# Patient Record
Sex: Female | Born: 1980 | Race: Black or African American | Hispanic: No | Marital: Single | Smoking: Never smoker
Health system: Southern US, Community
[De-identification: ages and names within clinical notes are randomized; demographics above are authoritative.]

## PROBLEM LIST (undated history)

## (undated) DIAGNOSIS — D279 Benign neoplasm of unspecified ovary: Secondary | ICD-10-CM

## (undated) DIAGNOSIS — D573 Sickle-cell trait: Secondary | ICD-10-CM

## (undated) DIAGNOSIS — A609 Anogenital herpesviral infection, unspecified: Secondary | ICD-10-CM

## (undated) HISTORY — PX: WISDOM TOOTH EXTRACTION: SHX21

---

## 2003-10-25 ENCOUNTER — Emergency Department (HOSPITAL_COMMUNITY): Admission: EM | Admit: 2003-10-25 | Discharge: 2003-10-26 | Payer: Self-pay | Admitting: Emergency Medicine

## 2004-10-13 IMAGING — US US PELVIS COMPLETE
1 series · 14 of 25 positions shown · non-contrast
Comparison: none

CLINICAL DATA: Positive pregnancy.  Abdominal cramping, vaginal bleeding. 
 TRANSABDOMINAL AND TRANSVAGINAL PELVIC ULTRASOUND ? 10/26/03 
 No intrauterine pregnancy is identified.  The uterus measures 10.6 x 4.6 x 5.6 cm.  
 Within the right ovary, there is a 2.9-cm complex cystic area, with a solid component/nodule.  The left ovary is unremarkable.  A small amount of free fluid is noted in the pelvis.  
 Endometrial stripe measures 15.0 mm.

[Series 1: ob · 0.32mm/px · 14 of 38 slices shown]
[im 1/38]
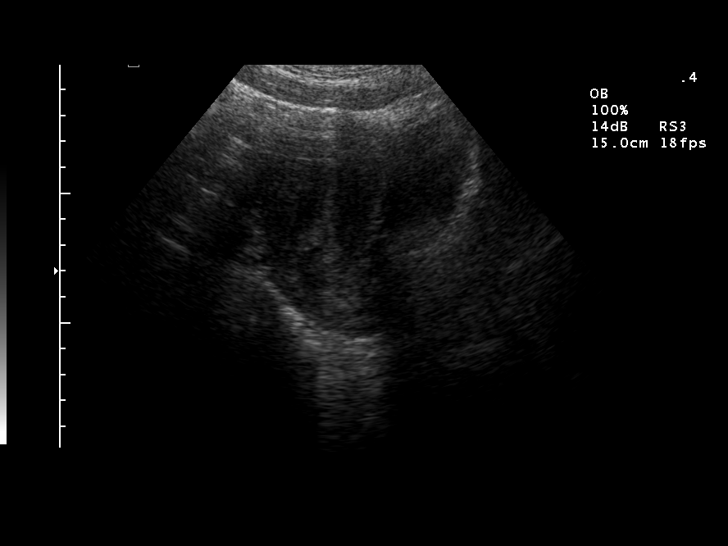
[im 4/38]
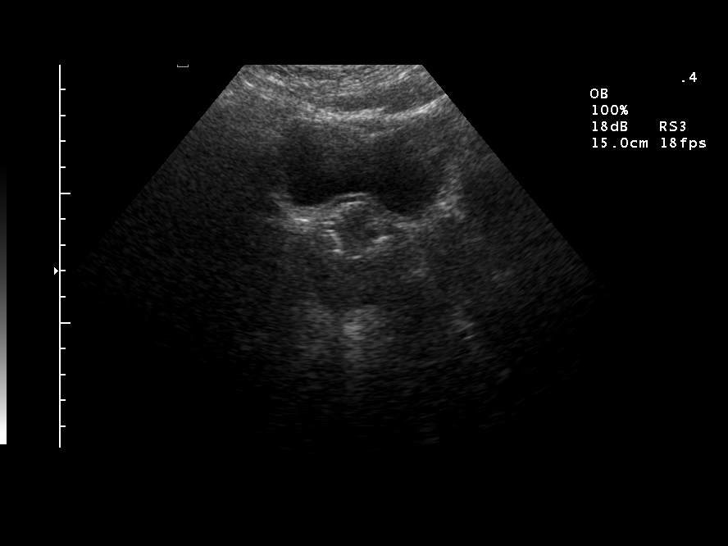
[im 7/38]
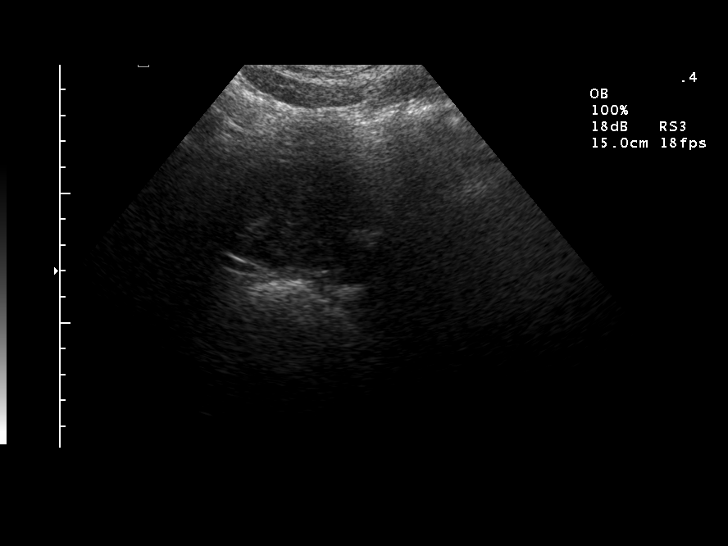
[im 10/38]
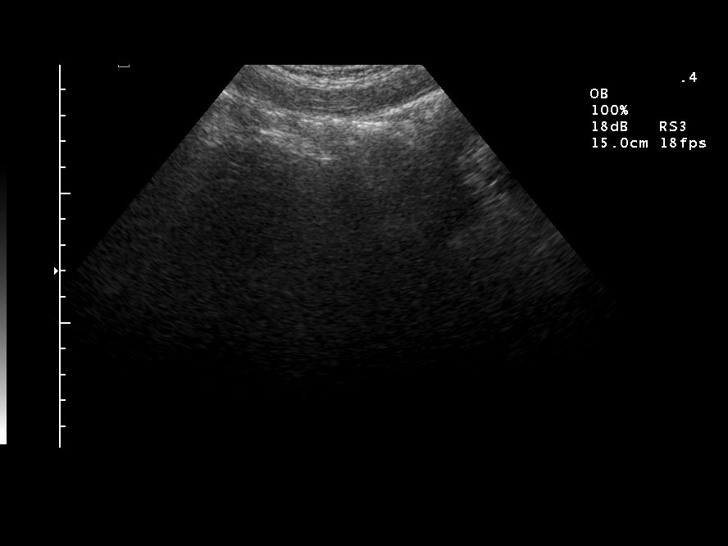
[im 13/38]
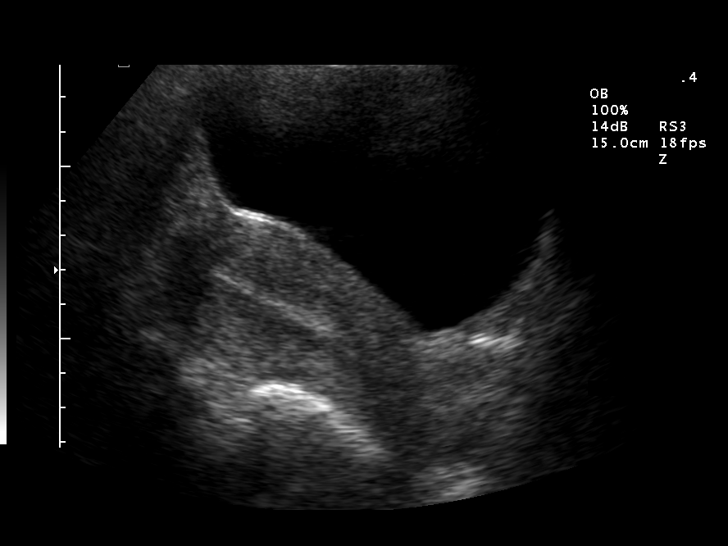
[im 14/38]
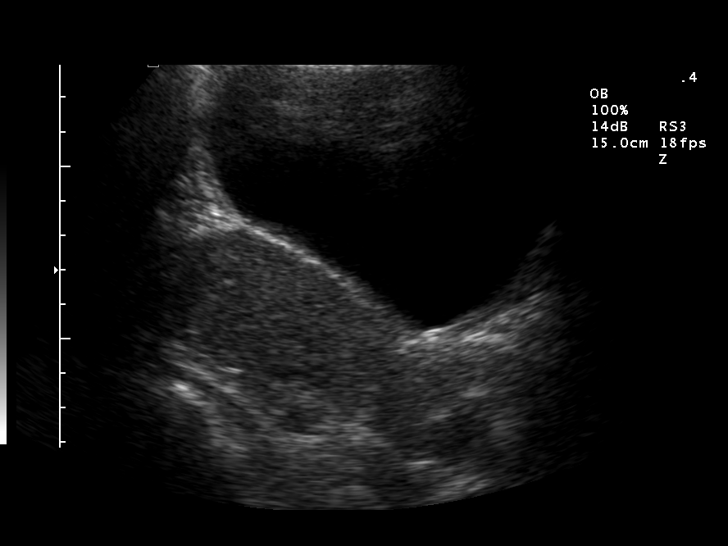
[im 17/38]
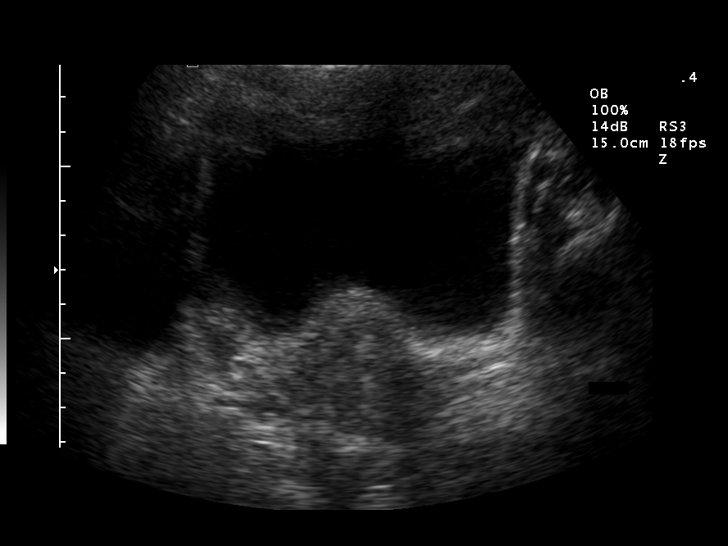
[im 21/38]
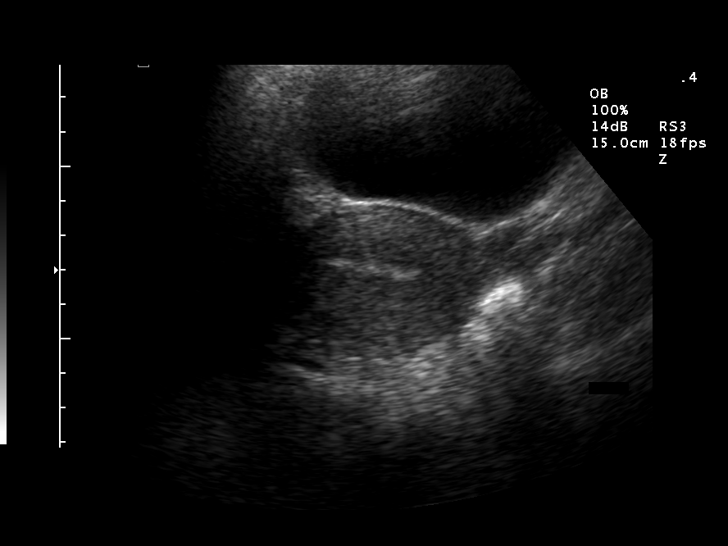
[im 24/38]
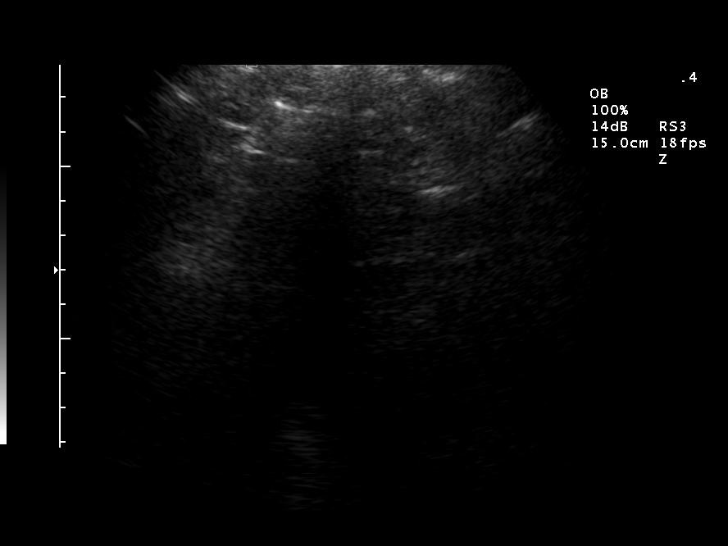
[im 25/38]
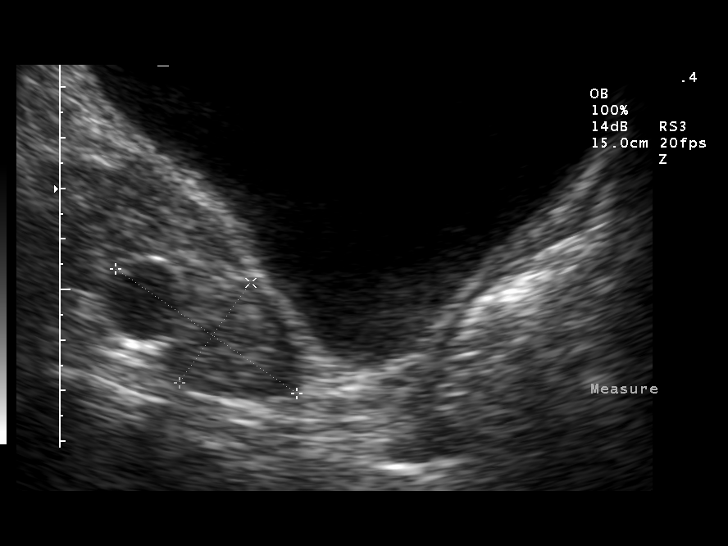
[im 28/38]
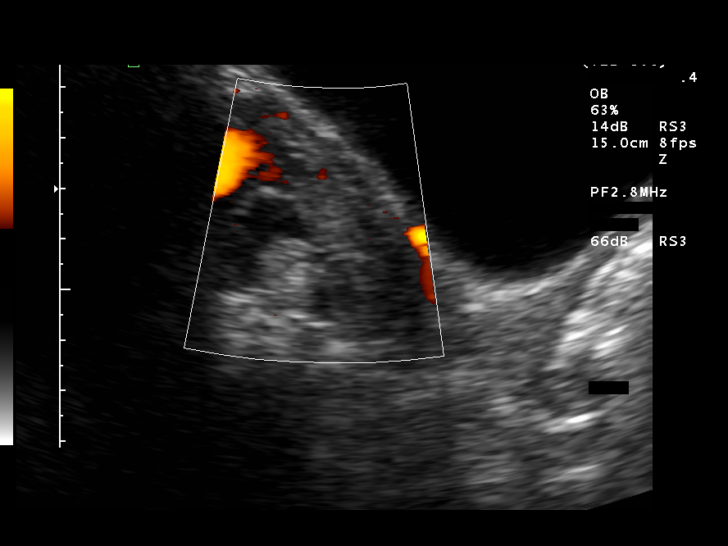
[im 31/38]
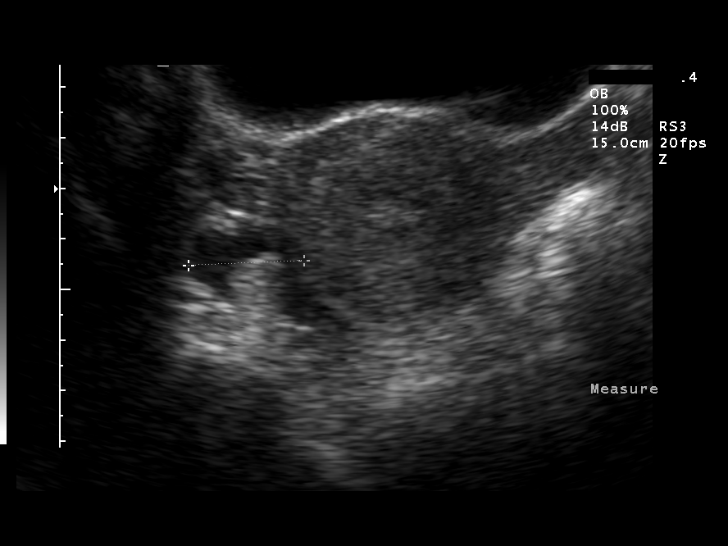
[im 34/38]
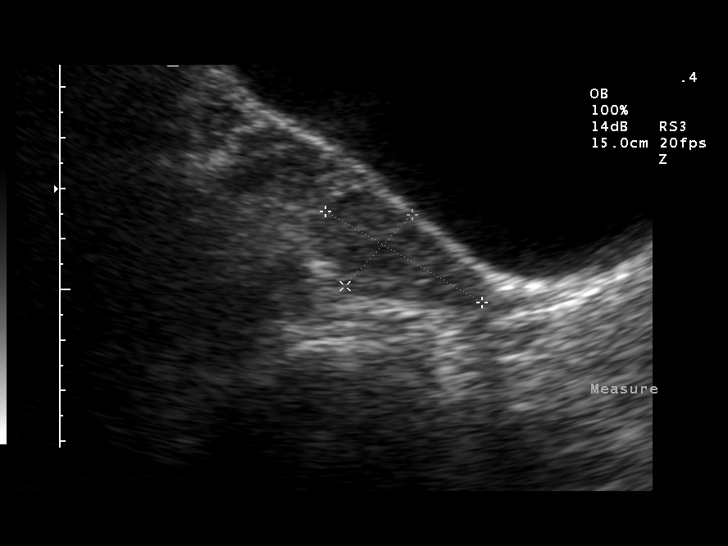
[im 38/38]
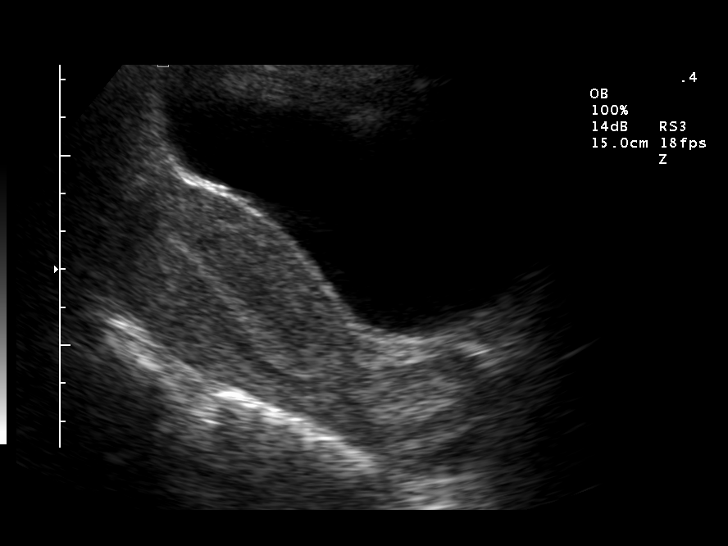

[14 of 25 positions shown; findings below may reference images not displayed]

IMPRESSION: Complex cystic area within the right ovary.  No intrauterine pregnancy present.    With a beta hCG of reportedly greater than [DATE], this constellation of findings could represent ectopic pregnancy or spontaneous abortion.  Recommend correlation with serial beta hCGs.
 Small amount of free fluid.

## 2012-04-07 ENCOUNTER — Ambulatory Visit: Payer: Self-pay | Admitting: Obstetrics and Gynecology

## 2012-05-05 ENCOUNTER — Encounter: Payer: Self-pay | Admitting: Obstetrics and Gynecology

## 2012-05-05 ENCOUNTER — Ambulatory Visit (INDEPENDENT_AMBULATORY_CARE_PROVIDER_SITE_OTHER): Payer: Commercial Managed Care - PPO | Admitting: Obstetrics and Gynecology

## 2012-05-05 VITALS — BP 104/62 | Ht 65.0 in | Wt 156.0 lb

## 2012-05-05 DIAGNOSIS — Z01419 Encounter for gynecological examination (general) (routine) without abnormal findings: Secondary | ICD-10-CM

## 2012-05-05 DIAGNOSIS — Z124 Encounter for screening for malignant neoplasm of cervix: Secondary | ICD-10-CM

## 2012-05-05 NOTE — Progress Notes (Signed)
ANNUAL GYNECOLOGIC EXAMINATION   Amy Giles is a 31 y.o. female, G1P0, who presents for an annual exam. She has a history of an ASCUS Pap smear with HPV dating back to 2009.  Her most recent Pap smears have been normal. She is not sexually active.  She has a history of polycystic ovary syndrome but her periods are now normal.   History   Social History  . Marital Status: Single    Spouse Name: N/A    Number of Children: N/A  . Years of Education: N/A   Social History Main Topics  . Smoking status: Never Smoker   . Smokeless tobacco: Never Used  . Alcohol Use: No  . Drug Use: No  . Sexually Active: No   Other Topics Concern  . None   Social History Narrative  . None    Menstrual cycle:   LMP: Patient's last menstrual period was 04/05/2012.             The following portions of the patient's history were reviewed and updated as appropriate: allergies, current medications, past family history, past medical history, past social history, past surgical history and problem list.  Review of Systems Pertinent items are noted in HPI. Breast:Negative for breast lump,nipple discharge or nipple retraction Gastrointestinal: Negative for abdominal pain, change in bowel habits or rectal bleeding Urinary:negative   Objective:    BP 104/62  Ht 5\' 5"  (1.651 m)  Wt 156 lb (70.761 kg)  BMI 25.96 kg/m2  LMP 04/05/2012    Weight:  Wt Readings from Last 1 Encounters:  05/05/12 156 lb (70.761 kg)          BMI: Body mass index is 25.96 kg/(m^2).  General Appearance: Alert, appropriate appearance for age. No acute distress HEENT: Grossly normal Neck / Thyroid: Supple, no masses, nodes or enlargement Lungs: clear to auscultation bilaterally Back: No CVA tenderness Breast Exam: No masses or nodes.No dimpling, nipple retraction or discharge. Cardiovascular: Regular rate and rhythm. S1, S2, no murmur Gastrointestinal: Soft, non-tender, no masses or  organomegaly  ++++++++++++++++++++++++++++++++++++++++++++++++++++++++  Pelvic Exam: External genitalia: normal general appearance Vaginal: normal without tenderness, induration or masses. Relaxation: No Cervix: normal appearance Adnexa: normal bimanual exam Uterus: normal size, shape, and consistency Rectovaginal: not indicated  ++++++++++++++++++++++++++++++++++++++++++++++++++++++++  Lymphatic Exam: Non-palpable nodes in neck, clavicular, axillary, or inguinal regions Neurologic: Normal speech, no tremor  Psychiatric: Alert and oriented, appropriate affect.  Assessment:    Normal gyn exam   Overweight or obese: Yes   Pelvic relaxation: No  Improved cycles.  History of ASCUS Pap smear with HPV dating back to 2009.   Plan:    pap smear return annually or prn Contraception:abstinence    Medications prescribed: none  STD screen request: No   The updated Pap smear screening guidelines were discussed with the patient. The patient requested that I obtain a Pap smear: Yes.  Kegel exercises discussed: Yes.  Proper diet and regular exercise were reviewed.  Annual mammograms recommended starting at age 90. Proper breast care was discussed.  Screening colonoscopy is recommended beginning at age 76.  Regular health maintenance was reviewed.  Sleep hygiene was discussed.  Adequate calcium and vitamin D intake was emphasized.  Leonard Schwartz M.D.    Regular Periods: yes Mammogram: no  Monthly Breast Ex.: no Exercise: yes  Tetanus < 10 years: unsure Seatbelts: yes  NI. Bladder Functn.: yes Abuse at home: no  Daily BM's: yes Stressful Work: no  Healthy Diet: yes Sigmoid-Colonoscopy: n/a  Calcium: no Medical problems this year: none   LAST PAP: 02/06/2011  Contraception: Abstinence   Mammogram:  N/a  PCP: [facility]  PMH: None  FMH: None  Last Bone Scan: N/a

## 2012-05-06 LAB — PAP IG W/ RFLX HPV ASCU

## 2014-05-17 ENCOUNTER — Encounter: Payer: Self-pay | Admitting: Obstetrics and Gynecology

## 2016-07-03 LAB — OB RESULTS CONSOLE RPR: RPR: NONREACTIVE

## 2016-07-03 LAB — OB RESULTS CONSOLE HIV ANTIBODY (ROUTINE TESTING): HIV: NONREACTIVE

## 2016-07-03 LAB — OB RESULTS CONSOLE HEPATITIS B SURFACE ANTIGEN: Hepatitis B Surface Ag: NEGATIVE

## 2016-07-03 LAB — OB RESULTS CONSOLE GC/CHLAMYDIA
CHLAMYDIA, DNA PROBE: NEGATIVE
Gonorrhea: NEGATIVE

## 2016-07-03 LAB — OB RESULTS CONSOLE ABO/RH: RH Type: POSITIVE

## 2016-07-03 LAB — OB RESULTS CONSOLE RUBELLA ANTIBODY, IGM: Rubella: IMMUNE

## 2016-07-03 LAB — OB RESULTS CONSOLE ANTIBODY SCREEN: Antibody Screen: NEGATIVE

## 2017-01-31 ENCOUNTER — Encounter (HOSPITAL_COMMUNITY): Payer: Self-pay

## 2017-02-04 ENCOUNTER — Other Ambulatory Visit: Payer: Self-pay | Admitting: Obstetrics and Gynecology

## 2017-02-05 ENCOUNTER — Inpatient Hospital Stay (HOSPITAL_COMMUNITY)
Admission: AD | Admit: 2017-02-05 | Discharge: 2017-02-08 | DRG: 765 | Disposition: A | Discharge status: Home / Self Care | Payer: Commercial Managed Care - PPO | Source: Ambulatory Visit | Attending: Obstetrics and Gynecology | Admitting: Obstetrics and Gynecology

## 2017-02-05 ENCOUNTER — Encounter (HOSPITAL_COMMUNITY): Payer: Self-pay | Admitting: Anesthesiology

## 2017-02-05 ENCOUNTER — Encounter (HOSPITAL_COMMUNITY): Payer: Self-pay | Admitting: *Deleted

## 2017-02-05 ENCOUNTER — Encounter (HOSPITAL_COMMUNITY)
Admission: AD | Disposition: A | Discharge status: Home / Self Care | Payer: Self-pay | Source: Ambulatory Visit | Attending: Obstetrics and Gynecology

## 2017-02-05 ENCOUNTER — Inpatient Hospital Stay (HOSPITAL_COMMUNITY): Payer: Commercial Managed Care - PPO | Admitting: Anesthesiology

## 2017-02-05 ENCOUNTER — Inpatient Hospital Stay (HOSPITAL_COMMUNITY): Payer: Commercial Managed Care - PPO

## 2017-02-05 DIAGNOSIS — A6000 Herpesviral infection of urogenital system, unspecified: Secondary | ICD-10-CM | POA: Diagnosis present

## 2017-02-05 DIAGNOSIS — O4292 Full-term premature rupture of membranes, unspecified as to length of time between rupture and onset of labor: Secondary | ICD-10-CM | POA: Diagnosis present

## 2017-02-05 DIAGNOSIS — D271 Benign neoplasm of left ovary: Secondary | ICD-10-CM | POA: Diagnosis present

## 2017-02-05 DIAGNOSIS — B009 Herpesviral infection, unspecified: Secondary | ICD-10-CM

## 2017-02-05 DIAGNOSIS — Z6837 Body mass index (BMI) 37.0-37.9, adult: Secondary | ICD-10-CM

## 2017-02-05 DIAGNOSIS — O321XX0 Maternal care for breech presentation, not applicable or unspecified: Secondary | ICD-10-CM | POA: Diagnosis present

## 2017-02-05 DIAGNOSIS — O9832 Other infections with a predominantly sexual mode of transmission complicating childbirth: Secondary | ICD-10-CM | POA: Diagnosis present

## 2017-02-05 DIAGNOSIS — B951 Streptococcus, group B, as the cause of diseases classified elsewhere: Secondary | ICD-10-CM

## 2017-02-05 DIAGNOSIS — O4290 Premature rupture of membranes, unspecified as to length of time between rupture and onset of labor, unspecified weeks of gestation: Secondary | ICD-10-CM

## 2017-02-05 DIAGNOSIS — O99824 Streptococcus B carrier state complicating childbirth: Secondary | ICD-10-CM | POA: Diagnosis present

## 2017-02-05 DIAGNOSIS — O9902 Anemia complicating childbirth: Secondary | ICD-10-CM | POA: Diagnosis present

## 2017-02-05 DIAGNOSIS — O99214 Obesity complicating childbirth: Secondary | ICD-10-CM | POA: Diagnosis present

## 2017-02-05 DIAGNOSIS — Z98891 History of uterine scar from previous surgery: Secondary | ICD-10-CM

## 2017-02-05 DIAGNOSIS — Z880 Allergy status to penicillin: Secondary | ICD-10-CM | POA: Diagnosis not present

## 2017-02-05 DIAGNOSIS — D279 Benign neoplasm of unspecified ovary: Secondary | ICD-10-CM

## 2017-02-05 DIAGNOSIS — Z3689 Encounter for other specified antenatal screening: Secondary | ICD-10-CM

## 2017-02-05 DIAGNOSIS — O3480 Maternal care for other abnormalities of pelvic organs, unspecified trimester: Secondary | ICD-10-CM

## 2017-02-05 DIAGNOSIS — Z3A38 38 weeks gestation of pregnancy: Secondary | ICD-10-CM

## 2017-02-05 DIAGNOSIS — O3483 Maternal care for other abnormalities of pelvic organs, third trimester: Secondary | ICD-10-CM | POA: Diagnosis present

## 2017-02-05 DIAGNOSIS — D573 Sickle-cell trait: Secondary | ICD-10-CM | POA: Diagnosis present

## 2017-02-05 HISTORY — PX: OVARIAN CYST REMOVAL: SHX89

## 2017-02-05 LAB — CBC
HCT: 33.4 % — ABNORMAL LOW (ref 36.0–46.0)
HEMOGLOBIN: 11.3 g/dL — AB (ref 12.0–15.0)
MCH: 26.7 pg (ref 26.0–34.0)
MCHC: 33.8 g/dL (ref 30.0–36.0)
MCV: 79 fL (ref 78.0–100.0)
PLATELETS: 323 10*3/uL (ref 150–400)
RBC: 4.23 MIL/uL (ref 3.87–5.11)
RDW: 14.5 % (ref 11.5–15.5)
WBC: 11 10*3/uL — ABNORMAL HIGH (ref 4.0–10.5)

## 2017-02-05 LAB — POCT FERN TEST: POCT Fern Test: POSITIVE

## 2017-02-05 LAB — TYPE AND SCREEN
ABO/RH(D): A POS
ANTIBODY SCREEN: NEGATIVE

## 2017-02-05 LAB — ABO/RH: ABO/RH(D): A POS

## 2017-02-05 SURGERY — Surgical Case
Anesthesia: Spinal

## 2017-02-05 MED ORDER — PROMETHAZINE HCL 25 MG/ML IJ SOLN: 6.2500 mg | INTRAMUSCULAR | Status: DC | PRN

## 2017-02-05 MED ORDER — DIPHENHYDRAMINE HCL 25 MG PO CAPS
25.0000 mg | ORAL_CAPSULE | ORAL | Status: DC | PRN
  Filled 2017-02-05: qty 1

## 2017-02-05 MED ORDER — OXYTOCIN 40 UNITS IN LACTATED RINGERS INFUSION - SIMPLE MED: 2.5000 [IU]/h | INTRAVENOUS | Status: AC

## 2017-02-05 MED ORDER — DIBUCAINE 1 % RE OINT: 1.0000 "application " | TOPICAL_OINTMENT | RECTAL | Status: DC | PRN

## 2017-02-05 MED ORDER — NALBUPHINE SYRINGE 5 MG/0.5 ML: 5.0000 mg | INJECTION | Freq: Once | INTRAMUSCULAR | Status: DC | PRN

## 2017-02-05 MED ORDER — COCONUT OIL OIL: 1.0000 "application " | TOPICAL_OIL | Status: DC | PRN

## 2017-02-05 MED ORDER — SOD CITRATE-CITRIC ACID 500-334 MG/5ML PO SOLN
30.0000 mL | Freq: Once | ORAL | Status: AC
  Administered 2017-02-05: 30 mL via ORAL

## 2017-02-05 MED ORDER — TETANUS-DIPHTH-ACELL PERTUSSIS 5-2.5-18.5 LF-MCG/0.5 IM SUSP: 0.5000 mL | Freq: Once | INTRAMUSCULAR | Status: DC

## 2017-02-05 MED ORDER — NALBUPHINE SYRINGE 5 MG/0.5 ML: 5.0000 mg | INJECTION | INTRAMUSCULAR | Status: DC | PRN

## 2017-02-05 MED ORDER — FAMOTIDINE IN NACL 20-0.9 MG/50ML-% IV SOLN
20.0000 mg | Freq: Once | INTRAVENOUS | Status: AC
  Administered 2017-02-05: 20 mg via INTRAVENOUS

## 2017-02-05 MED ORDER — VALACYCLOVIR HCL 500 MG PO TABS
1000.0000 mg | ORAL_TABLET | Freq: Every day | ORAL | Status: DC
  Filled 2017-02-05 (×2): qty 2

## 2017-02-05 MED ORDER — SIMETHICONE 80 MG PO CHEW
80.0000 mg | CHEWABLE_TABLET | Freq: Three times a day (TID) | ORAL | Status: DC
  Administered 2017-02-05 – 2017-02-08 (×6): 80 mg via ORAL
  Filled 2017-02-05 (×7): qty 1

## 2017-02-05 MED ORDER — IBUPROFEN 600 MG PO TABS
600.0000 mg | ORAL_TABLET | Freq: Four times a day (QID) | ORAL | Status: DC
  Administered 2017-02-05 – 2017-02-08 (×12): 600 mg via ORAL
  Filled 2017-02-05 (×12): qty 1

## 2017-02-05 MED ORDER — KETOROLAC TROMETHAMINE 30 MG/ML IJ SOLN
30.0000 mg | Freq: Four times a day (QID) | INTRAMUSCULAR | Status: DC | PRN
  Administered 2017-02-05: 30 mg via INTRAMUSCULAR

## 2017-02-05 MED ORDER — ZOLPIDEM TARTRATE 5 MG PO TABS: 5.0000 mg | ORAL_TABLET | Freq: Every evening | ORAL | Status: DC | PRN

## 2017-02-05 MED ORDER — MENTHOL 3 MG MT LOZG: 1.0000 | LOZENGE | OROMUCOSAL | Status: DC | PRN

## 2017-02-05 MED ORDER — FENTANYL CITRATE (PF) 100 MCG/2ML IJ SOLN
INTRAMUSCULAR | Status: AC
  Filled 2017-02-05: qty 2

## 2017-02-05 MED ORDER — BUPIVACAINE IN DEXTROSE 0.75-8.25 % IT SOLN
INTRATHECAL | Status: AC
  Filled 2017-02-05: qty 2

## 2017-02-05 MED ORDER — ONDANSETRON HCL 4 MG/2ML IJ SOLN
INTRAMUSCULAR | Status: AC
  Filled 2017-02-05: qty 2

## 2017-02-05 MED ORDER — BUPIVACAINE IN DEXTROSE 0.75-8.25 % IT SOLN
INTRATHECAL | Status: DC | PRN
  Administered 2017-02-05: 12 mg via INTRATHECAL

## 2017-02-05 MED ORDER — NALOXONE HCL 0.4 MG/ML IJ SOLN: 0.4000 mg | INTRAMUSCULAR | Status: DC | PRN

## 2017-02-05 MED ORDER — PHENYLEPHRINE 8 MG IN D5W 100 ML (0.08MG/ML) PREMIX OPTIME
INJECTION | INTRAVENOUS | Status: DC | PRN
  Administered 2017-02-05: 60 ug/min via INTRAVENOUS

## 2017-02-05 MED ORDER — LACTATED RINGERS IV SOLN
INTRAVENOUS | Status: DC | PRN
  Administered 2017-02-05: 40 [IU] via INTRAVENOUS

## 2017-02-05 MED ORDER — KETOROLAC TROMETHAMINE 30 MG/ML IJ SOLN: 30.0000 mg | Freq: Four times a day (QID) | INTRAMUSCULAR | Status: DC | PRN

## 2017-02-05 MED ORDER — HYDROMORPHONE HCL 1 MG/ML IJ SOLN: 0.2500 mg | INTRAMUSCULAR | Status: DC | PRN

## 2017-02-05 MED ORDER — MEPERIDINE HCL 25 MG/ML IJ SOLN: 6.2500 mg | INTRAMUSCULAR | Status: DC | PRN

## 2017-02-05 MED ORDER — SODIUM CHLORIDE 0.9% FLUSH: 3.0000 mL | INTRAVENOUS | Status: DC | PRN

## 2017-02-05 MED ORDER — KETOROLAC TROMETHAMINE 30 MG/ML IJ SOLN: 30.0000 mg | Freq: Once | INTRAMUSCULAR | Status: DC | PRN

## 2017-02-05 MED ORDER — PRENATAL MULTIVITAMIN CH
1.0000 | ORAL_TABLET | Freq: Every day | ORAL | Status: DC
  Administered 2017-02-06 – 2017-02-07 (×2): 1 via ORAL
  Filled 2017-02-05 (×2): qty 1

## 2017-02-05 MED ORDER — DEXTROSE 5 % IV SOLN
1.0000 ug/kg/h | INTRAVENOUS | Status: DC | PRN
  Filled 2017-02-05: qty 2

## 2017-02-05 MED ORDER — KETOROLAC TROMETHAMINE 30 MG/ML IJ SOLN
INTRAMUSCULAR | Status: AC
  Filled 2017-02-05: qty 1

## 2017-02-05 MED ORDER — DIPHENHYDRAMINE HCL 25 MG PO CAPS
25.0000 mg | ORAL_CAPSULE | Freq: Four times a day (QID) | ORAL | Status: DC | PRN
  Administered 2017-02-05 – 2017-02-06 (×2): 25 mg via ORAL
  Filled 2017-02-05 (×2): qty 1

## 2017-02-05 MED ORDER — ONDANSETRON HCL 4 MG/2ML IJ SOLN
INTRAMUSCULAR | Status: DC | PRN
  Administered 2017-02-05: 4 mg via INTRAVENOUS

## 2017-02-05 MED ORDER — LACTATED RINGERS IV SOLN
INTRAVENOUS | Status: DC
Start: 2017-02-05 — End: 2017-02-05
  Administered 2017-02-05 (×3): via INTRAVENOUS

## 2017-02-05 MED ORDER — FAMOTIDINE IN NACL 20-0.9 MG/50ML-% IV SOLN
INTRAVENOUS | Status: AC
  Filled 2017-02-05: qty 50

## 2017-02-05 MED ORDER — SIMETHICONE 80 MG PO CHEW
80.0000 mg | CHEWABLE_TABLET | ORAL | Status: DC
  Administered 2017-02-05 – 2017-02-07 (×3): 80 mg via ORAL
  Filled 2017-02-05 (×3): qty 1

## 2017-02-05 MED ORDER — GENTAMICIN SULFATE 40 MG/ML IJ SOLN
INTRAMUSCULAR | Status: AC
  Administered 2017-02-05: 115.5 mL via INTRAVENOUS
  Filled 2017-02-05: qty 9.5

## 2017-02-05 MED ORDER — DIPHENHYDRAMINE HCL 50 MG/ML IJ SOLN: 12.5000 mg | INTRAMUSCULAR | Status: DC | PRN

## 2017-02-05 MED ORDER — OXYTOCIN 10 UNIT/ML IJ SOLN
INTRAMUSCULAR | Status: AC
  Filled 2017-02-05: qty 4

## 2017-02-05 MED ORDER — MORPHINE SULFATE (PF) 0.5 MG/ML IJ SOLN
INTRAMUSCULAR | Status: DC | PRN
  Administered 2017-02-05: .2 mg via INTRATHECAL

## 2017-02-05 MED ORDER — SENNOSIDES-DOCUSATE SODIUM 8.6-50 MG PO TABS
2.0000 | ORAL_TABLET | ORAL | Status: DC
  Administered 2017-02-05 – 2017-02-07 (×3): 2 via ORAL
  Filled 2017-02-05 (×3): qty 2

## 2017-02-05 MED ORDER — SOD CITRATE-CITRIC ACID 500-334 MG/5ML PO SOLN
ORAL | Status: AC
  Filled 2017-02-05: qty 15

## 2017-02-05 MED ORDER — SCOPOLAMINE 1 MG/3DAYS TD PT72: 1.0000 | MEDICATED_PATCH | Freq: Once | TRANSDERMAL | Status: DC

## 2017-02-05 MED ORDER — ACETAMINOPHEN 500 MG PO TABS: 1000.0000 mg | ORAL_TABLET | Freq: Four times a day (QID) | ORAL | Status: DC

## 2017-02-05 MED ORDER — ONDANSETRON HCL 4 MG/2ML IJ SOLN
4.0000 mg | Freq: Three times a day (TID) | INTRAMUSCULAR | Status: DC | PRN
Start: 2017-02-05 — End: 2017-02-05

## 2017-02-05 MED ORDER — SIMETHICONE 80 MG PO CHEW: 80.0000 mg | CHEWABLE_TABLET | ORAL | Status: DC | PRN

## 2017-02-05 MED ORDER — ACETAMINOPHEN 325 MG PO TABS
650.0000 mg | ORAL_TABLET | ORAL | Status: DC | PRN
  Administered 2017-02-05 – 2017-02-07 (×2): 650 mg via ORAL
  Filled 2017-02-05 (×2): qty 2

## 2017-02-05 MED ORDER — MORPHINE SULFATE (PF) 0.5 MG/ML IJ SOLN
INTRAMUSCULAR | Status: AC
  Filled 2017-02-05: qty 10

## 2017-02-05 MED ORDER — WITCH HAZEL-GLYCERIN EX PADS: 1.0000 "application " | MEDICATED_PAD | CUTANEOUS | Status: DC | PRN

## 2017-02-05 MED ORDER — FENTANYL CITRATE (PF) 100 MCG/2ML IJ SOLN
INTRAMUSCULAR | Status: DC | PRN
  Administered 2017-02-05: 20 ug via INTRAVENOUS

## 2017-02-05 MED ORDER — LACTATED RINGERS IV SOLN
INTRAVENOUS | Status: DC
  Administered 2017-02-05: 19:00:00 via INTRAVENOUS

## 2017-02-05 SURGICAL SUPPLY — 37 items
BENZOIN TINCTURE PRP APPL 2/3 (GAUZE/BANDAGES/DRESSINGS) ×4 IMPLANT
CHLORAPREP W/TINT 26ML (MISCELLANEOUS) ×4 IMPLANT
CLAMP CORD UMBIL (MISCELLANEOUS) IMPLANT
CLOSURE STERI STRIP 1/2 X4 (GAUZE/BANDAGES/DRESSINGS) ×3 IMPLANT
CLOSURE WOUND 1/2 X4 (GAUZE/BANDAGES/DRESSINGS) ×1
CLOTH BEACON ORANGE TIMEOUT ST (SAFETY) ×4 IMPLANT
CONTAINER PREFILL 10% NBF 15ML (MISCELLANEOUS) IMPLANT
DRSG OPSITE POSTOP 4X10 (GAUZE/BANDAGES/DRESSINGS) ×4 IMPLANT
ELECT REM PT RETURN 9FT ADLT (ELECTROSURGICAL) ×4
ELECTRODE REM PT RTRN 9FT ADLT (ELECTROSURGICAL) ×2 IMPLANT
EXTRACTOR VACUUM M CUP 4 TUBE (SUCTIONS) IMPLANT
EXTRACTOR VACUUM M CUP 4' TUBE (SUCTIONS)
GLOVE BIO SURGEON STRL SZ7.5 (GLOVE) ×4 IMPLANT
GLOVE BIOGEL PI IND STRL 7.0 (GLOVE) ×2 IMPLANT
GLOVE BIOGEL PI IND STRL 7.5 (GLOVE) ×2 IMPLANT
GLOVE BIOGEL PI INDICATOR 7.0 (GLOVE) ×2
GLOVE BIOGEL PI INDICATOR 7.5 (GLOVE) ×2
GOWN STRL REUS W/TWL LRG LVL3 (GOWN DISPOSABLE) ×8 IMPLANT
HEMOSTAT SURGICEL 2X3 (HEMOSTASIS) ×4 IMPLANT
KIT ABG SYR 3ML LUER SLIP (SYRINGE) IMPLANT
NEEDLE HYPO 25X5/8 SAFETYGLIDE (NEEDLE) IMPLANT
NS IRRIG 1000ML POUR BTL (IV SOLUTION) ×4 IMPLANT
PACK C SECTION WH (CUSTOM PROCEDURE TRAY) ×4 IMPLANT
PAD OB MATERNITY 4.3X12.25 (PERSONAL CARE ITEMS) ×4 IMPLANT
PENCIL SMOKE EVAC W/HOLSTER (ELECTROSURGICAL) ×4 IMPLANT
RTRCTR C-SECT PINK 25CM LRG (MISCELLANEOUS) ×4 IMPLANT
STRIP CLOSURE SKIN 1/2X4 (GAUZE/BANDAGES/DRESSINGS) ×3 IMPLANT
SUT CHROMIC 2 0 CT 1 (SUTURE) ×4 IMPLANT
SUT MNCRL AB 3-0 PS2 27 (SUTURE) ×4 IMPLANT
SUT PLAIN 2 0 XLH (SUTURE) ×4 IMPLANT
SUT VIC AB 0 CT1 36 (SUTURE) ×4 IMPLANT
SUT VIC AB 0 CTX 36 (SUTURE) ×6
SUT VIC AB 0 CTX36XBRD ANBCTRL (SUTURE) ×6 IMPLANT
SUT VIC AB 2-0 SH 27 (SUTURE) ×4
SUT VIC AB 2-0 SH 27XBRD (SUTURE) ×4 IMPLANT
TOWEL OR 17X24 6PK STRL BLUE (TOWEL DISPOSABLE) ×4 IMPLANT
TRAY FOLEY BAG SILVER LF 14FR (SET/KITS/TRAYS/PACK) ×4 IMPLANT

## 2017-02-05 NOTE — Lactation Note (Signed)
This note was copied from a baby's chart. Lactation Consultation Note  Patient Name: Amy Giles [id number] Date: 02/05/2017 Reason for consult: Initial assessment Infant is 18 hours old and seen by Lactation for Initial assessment. Baby was born at [redacted]w[redacted]d. RN had told LC that mom had decided to pump and bottle feed. Baby was with mom when [place] entered. Mom reported that she tried BF a few times but he wouldn't latch so she wants to just pump. Mom reports she pumped once and didn't get any milk.  Discussed milk volume, stomach size, difference between pumping & BF especially in the early days, how BF is a new skill that can take practice, offered latch help from her RN or IBCLC but that it is her decision and that staff are here to support her. Reviewed pumping goals of at least 8-12x in 24 hrs for 15-20 mins followed by ~5 mins of hand expression. Mom stated she would like to be shown how to hand express later so LC just reviewed technique and encouraged mom to ask her RN later. Mom reports she has a personal pump from insurance but was unsure of the brand and plans to have someone bring it to the hospital for staff to review it with her. Discussed difference between personal & hospital-grade pumps for exclusive pumping. Provided mom with BF booklet, BF resources, and feeding log; mom made aware of O/P services, breastfeeding support groups, community resources, and our phone # for post-discharge questions.  Mom reports no questions at this time. Encouraged mom to consider trying to BF again with assistance but if not, to at least continue pumping q 3hrs, while not watching the bottles and that no to a few drops is normal for the first few days of pumping. Encouraged mom to ask for help as needed.   Maternal Data    Feeding Feeding Type: Bottle Fed - Formula Nipple Type: Slow - flow  LATCH Score/Interventions                      Lactation Tools Discussed/Used Pump Review: Setup,  frequency, and cleaning Initiated by:: EB Date initiated:: 02/05/17   Consult Status Consult Status: Follow-up Date: 02/06/17 Follow-up type: In-patient    Amy Giles 02/05/2017, 6:46 PM

## 2017-02-05 NOTE — MAU Note (Signed)
Pt presents to MAU with complaints rupture of membranes at 800 this morning. Denies any VB. Pt is scheduled for C/S on Monday July the 30th due to breech presentation

## 2017-02-05 NOTE — Anesthesia Postprocedure Evaluation (Signed)
Anesthesia Post Note  Patient: Amy Giles  Procedure(s) Performed: Procedure(s) (LRB): CESAREAN SECTION (N/A) OVARIAN CYSTECTOMY (Left)     Patient location during evaluation: PACU Anesthesia Type: Spinal Level of consciousness: awake Pain management: pain level controlled Vital Signs Assessment: post-procedure vital signs reviewed and stable Respiratory status: spontaneous breathing Cardiovascular status: stable Postop Assessment: no backache, spinal receding, no headache, patient able to bend at knees and no signs of nausea or vomiting Anesthetic complications: no    Last Vitals:  Vitals:   02/05/17 1434 02/05/17 1436  BP: 121/65   Pulse:    Resp: (!) 22 15  Temp:      Last Pain:  Vitals:   02/05/17 1316  TempSrc: Oral   Pain Goal:                 Careli Luzader JR,JOHN Coumba Kellison

## 2017-02-05 NOTE — Anesthesia Procedure Notes (Addendum)
Spinal  Start time: 02/05/2017 11:35 AM End time: 02/05/2017 11:38 AM Staffing Anesthesiologist: Lyn Hollingshead Performed: anesthesiologist  Preanesthetic Checklist Completed: patient identified, surgical consent, pre-op evaluation, timeout performed, IV checked, risks and benefits discussed and monitors and equipment checked Spinal Block Patient position: sitting Prep: site prepped and draped and DuraPrep Patient monitoring: heart rate, cardiac monitor, continuous pulse ox and blood pressure Location: L3-4 Needle Needle type: Pencan  Needle gauge: 24 G Needle insertion depth: 5 cm Assessment Sensory level: T4

## 2017-02-05 NOTE — Anesthesia Preprocedure Evaluation (Signed)
Anesthesia Evaluation  Patient identified by MRN, date of birth, ID band Patient awake    Reviewed: Allergy & Precautions, H&P , NPO status , Patient's Chart, lab work & pertinent test results  Airway Mallampati: II  TM Distance: >3 FB Neck ROM: full    Dental no notable dental hx. (+) Teeth Intact   Pulmonary neg pulmonary ROS,    Pulmonary exam normal breath sounds clear to auscultation       Cardiovascular negative cardio ROS Normal cardiovascular exam Rhythm:regular Rate:Normal     Neuro/Psych negative neurological ROS  negative psych ROS   GI/Hepatic negative GI ROS, Neg liver ROS,   Endo/Other  Morbid obesity  Renal/GU negative Renal ROS     Musculoskeletal   Abdominal (+) + obese,   Peds  Hematology negative hematology ROS (+) Blood dyscrasia, anemia ,   Anesthesia Other Findings   Reproductive/Obstetrics (+) Pregnancy                             Anesthesia Physical Anesthesia Plan  ASA: III  Anesthesia Plan: Spinal   Post-op Pain Management:    Induction:   PONV Risk Score and Plan: 3 and Ondansetron, Dexamethasone, Propofol, Midazolam and Treatment may vary due to age or medical condition  Airway Management Planned:   Additional Equipment:   Intra-op Plan:   Post-operative Plan:   Informed Consent: I have reviewed the patients History and Physical, chart, labs and discussed the procedure including the risks, benefits and alternatives for the proposed anesthesia with the patient or authorized representative who has indicated his/her understanding and acceptance.     Plan Discussed with: CRNA and Surgeon  Anesthesia Plan Comments:         Anesthesia Quick Evaluation

## 2017-02-05 NOTE — Op Note (Addendum)
Cesarean Section Procedure Note  Indications: P0 at 35 2/7wks presenting with PROM and breech.  Pt initially scheduled for c-section on 7/30th.  Pre-operative Diagnosis: 1.38 2/7wks 2.Breech Presentation 3.PROM 4.LEFT OVARIAN CYST   Post-operative Diagnosis: 1.38 2/7wks 2.Breech Presentation 3.PROM 4.LEFT OVARIAN CYST  Procedure: 1.PRIMARY LOW TRANSVERSE CESAREAN SECTION 2.LEFT OVARIAN CYSTECTOMY  Surgeon: Everett Graff, MD    Assistants: Yvonne Kendall, CNM  Anesthesia: Regional  Anesthesiologist: Dr. York Cerise   Procedure Details  The patient was taken to the operating room secondary to breech and PROM after the risks, benefits, complications, treatment options, and expected outcomes were discussed with the patient.  The patient concurred with the proposed plan, giving informed consent which was signed and witnessed. The patient was taken to Operating Room One, identified as Amy Giles and the procedure verified as C-Section Delivery. A Time Out was held and the above information confirmed.  After induction of anesthesia by obtaining a spinal, the patient was prepped and draped in the usual sterile manner. A Pfannenstiel skin incision was made and carried down through the subcutaneous tissue to the underlying layer of fascia.  The fascia was incised bilaterally and extended transversely bilaterally with the Mayo scissors. Kocher clamps were placed on the inferior aspect of the fascial incision and the underlying rectus muscle was separated from the fascia. The same was done on the superior aspect of the fascial incision.  The peritoneum was identified, entered bluntly and extended manually.  An Alexis self-retaining retractor was placed.  The utero-vesical peritoneal reflection was incised transversely and the bladder flap was bluntly freed from the lower uterine segment. A low transverse uterine incision was made with the scalpel and extended bilaterally with the bandage  scissors.  The infant was delivered ivia breech extraction.  After the umbilical cord was clamped and cut, the infant was handed to the awaiting pediatricians.  Cord blood was obtained for evaluation.  The placenta was removed intact and appeared to be within normal limits. The uterus was cleared of all clots and debris. The uterine incision was closed with running interlocking sutures of 0 Vicryl and a second imbricating layer was performed as well.   Bilateral tubes and ovaries appeared to be within normal limits except for a known left ovarian cyst.  An incision was made over the ovarian wall covering cyst.  Cyst was dissected out without difficulty without rupture and handed off to be sent to pathology.  The ovarian bed was made hemostatic with the bovie.  Good hemostasis was noted.  To ensure hemostasis, surgicel was placed in the ovarian bed.  Copious irrigation was performed until clear.  The peritoneum was repaired with 2-0 chromic via a running suture.  The fascia was reapproximated with a running suture of 0 Vicryl. The subcutaneous tissue was reapproximated with 3 interrupted sutures of 2-0 plain.  The skin was reapproximated with a subcuticular suture of 3-0 monocryl.  Steristrips were applied.  Instrument, sponge, and needle counts were correct prior to abdominal closure and at the conclusion of the case.  The patient was awaiting transfer to the recovery room in good condition.  Findings: Live female infant with Apgars 7 at one minute and 9 at five minutes.  Normal appearing bilateral ovaries and fallopian tubes were noted except for the left ovarian cyst noted above that was about 3cm.    Estimated Blood Loss:  800 ml         Drains: foley to gravity 325 cc  Total IV Fluids: 2400 ml         Specimens to Pathology: Left Ovarian Cyst (intact)         Complications:  None; patient tolerated the procedure well.         Disposition: PACU - hemodynamically stable.         Condition:  stable  Attending Attestation: I performed the procedure.

## 2017-02-05 NOTE — H&P (Signed)
LABOR ADMISSION HISTORY AND PHYSICAL  Amy Giles is a 36 y.o. female G2P0010 with IUP at [redacted]w[redacted]d  presenting for gross SROM @ 0800am. Her pregnancy is complicated by breech presentation; HSV 1 vaginal- suppression started at 36 weeks.; GBS positive- allergic to pcn and dermoid cyst on ovary and sickle cell trait.  She reports +FMs, No LOF, no VB, no blurry vision, headaches or peripheral edema, and RUQ pain.  She plans on breastfeeding feeding. Inpatient Circumcision   3881g EFW   Prenatal History/Complications:  Past Medical History: Past Medical History:  Diagnosis Date  . Benign teratoma of ovary   . HSV (herpes simplex virus) anogenital infection   . Sickle cell trait [facility])     Past Surgical History: Past Surgical History:  Procedure Laterality Date  . WISDOM TOOTH EXTRACTION      Obstetrical History: OB History    Gravida Para Term Preterm AB Living   2 0     1 0   SAB TAB Ectopic Multiple Live Births   1              Social History: Social History   Social History  . Marital status: Single    Spouse name: N/A  . Number of children: N/A  . Years of education: N/A   Social History Main Topics  . Smoking status: Never Smoker  . Smokeless tobacco: Never Used  . Alcohol use No  . Drug use: No  . Sexual activity: No   Other Topics Concern  . None   Social History Narrative  . None    Family History: Family History  Problem Relation Age of Onset  . Hypertension Mother   . Hypertension Maternal Aunt   . Diabetes Maternal Aunt   . Hypertension Maternal Grandmother   . Cancer Maternal Grandmother     Allergies: Allergies  Allergen Reactions  . Penicillins Rash    Has patient had a PCN reaction causing immediate rash, facial/tongue/throat swelling, SOB or lightheadedness with hypotension: Yes Has patient had a PCN reaction causing severe rash involving mucus membranes or skin necrosis: Yes Has patient had a PCN reaction that required  hospitalization: No Has patient had a PCN reaction occurring within the last 10 years: No If all of the above answers are "NO", then may proceed with Cephalosporin use.    Prescriptions Prior to Admission  Medication Sig Dispense Refill Last Dose  . calcium carbonate (TUMS - DOSED IN MG ELEMENTAL CALCIUM) 500 MG chewable tablet Chew 1-2 tablets by mouth daily.   02/04/2017 at Unknown time  . Prenatal Vit-Fe Fumarate-FA (PRENATAL MULTIVITAMIN) TABS tablet Take 1 tablet by mouth daily at 12 noon.   02/04/2017 at Unknown time  . valACYclovir (VALTREX) 1000 MG tablet TK 1 T PO QD UTD  1 02/04/2017 at Unknown time     Review of Systems   All systems reviewed and negative except as stated in HPI  Blood pressure 128/77, pulse 90, temperature 97.9 F (36.6 C), temperature source Oral, resp. rate 18, last menstrual period 05/13/2016, SpO2 96 %. General appearance: alert, cooperative and appears stated age Lungs: clear to auscultation bilaterally Heart: regular rate and rhythm Abdomen: soft, non-tender; bowel sounds normal Pelvic: wnl; large amount clear fluid SVE 1/th/-3 Extremities: Homans sign is negative, no sign of DVT DTR's wnl Presentation: breech Fetal monitoringCategory 1 Uterine activityIrregular Mild     Prenatal labs: ABO, Rh: --/--/A POS (07/24 9735) Antibody: PENDING (07/24 3299) Rubella: !Error! RPR: Nonreactive (12/19 0000)  HBsAg: Negative (12/19 0000)  HIV: Non-reactive (12/19 0000)  GBS:   positive 1 hr Glucola Failed 1 hour and passed 3 hour Genetic screening  Quad SCreen normal Anatomy US wnl  Prenatal Transfer Tool  Maternal Diabetes: category 1 Genetic Screening: Normal Maternal Ultrasounds/Referrals: Normal Fetal Ultrasounds or other Referrals:  None Maternal Substance Abuse:  No Significant Maternal Medications:  Meds include: Other: Valtrex Significant Maternal Lab Results: Lab values include: Group B Strep positive, Other: HSV 1  Results for orders  placed or performed during the hospital encounter of 02/05/17 (from the past 24 hour(s))  POCT fern test   Collection Time: 02/05/17  9:07 AM  Result Value Ref Range   POCT Fern Test Positive = ruptured amniotic membanes   Type and screen   Collection Time: 02/05/17  9:38 AM  Result Value Ref Range   ABO/RH(D) A POS    Antibody Screen PENDING    Sample Expiration 02/08/2017   CBC   Collection Time: 02/05/17  9:39 AM  Result Value Ref Range   WBC 11.0 (H) 4.0 - 10.5 K/uL   RBC 4.23 3.87 - 5.11 MIL/uL   Hemoglobin 11.3 (L) 12.0 - 15.0 g/dL   HCT 33.4 (L) 36.0 - 46.0 %   MCV 79.0 78.0 - 100.0 fL   MCH 26.7 26.0 - 34.0 pg   MCHC 33.8 30.0 - 36.0 g/dL   RDW 14.5 11.5 - 15.5 %   Platelets 323 150 - 400 K/uL    There are no active problems to display for this patient.   Assessment: Amy Giles is a 36 y.o. G2P0010 at [redacted]w[redacted]d here for SROM, breech presentation. Urgent primary C/S Dermoid Cyst of ovary #Labor: Early #Pain: 2  #ID:  GBS positive #MOF: Breast  #Circ:  Inpatient  Yvonne Kendall CNM @ 1039am  02/05/2017, 10:32 AM

## 2017-02-05 NOTE — Addendum Note (Signed)
Addendum  created 02/05/17 1853 by Flossie Dibble, CRNA   Sign clinical note

## 2017-02-05 NOTE — Interval H&P Note (Signed)
History and Physical Interval Note:  02/05/2017 11:21 AM  Amy Giles  has presented today for surgery, with the diagnosis of primary cesarean for breech presentation  The various methods of treatment have been discussed with the patient and family. After consideration of risks, benefits and other options for treatment, the patient has consented to  Procedure(s): CESAREAN SECTION (N/A) and POSSIBLE CYSTECTOMY as a surgical intervention .  The patient's history has been reviewed, patient examined, no change in status, stable for surgery.  I have reviewed the patient's chart and labs.  Questions were answered to the patient's satisfaction.     [name]

## 2017-02-05 NOTE — Transfer of Care (Signed)
Immediate Anesthesia Transfer of Care Note  Patient: Amy Giles  Procedure(s) Performed: Procedure(s): CESAREAN SECTION (N/A) OVARIAN CYSTECTOMY (Left)  Patient Location: PACU  Anesthesia Type:Spinal  Level of Consciousness: awake  Airway & Oxygen Therapy: Patient Spontanous Breathing  Post-op Assessment: Report given to RN  Post vital signs: Reviewed and stable  Last Vitals:  Vitals:   02/05/17 0903  BP: 128/77  Pulse: 90  Resp: 18  Temp: 36.6 C    Last Pain:  Vitals:   02/05/17 0903  TempSrc: Oral         Complications: No apparent anesthesia complications

## 2017-02-05 NOTE — Anesthesia Postprocedure Evaluation (Signed)
Anesthesia Post Note  Patient: Amy Giles  Procedure(s) Performed: Procedure(s) (LRB): CESAREAN SECTION (N/A) OVARIAN CYSTECTOMY (Left)     Patient location during evaluation: Mother Baby Anesthesia Type: Spinal Level of consciousness: awake and alert and oriented Pain management: satisfactory to patient Vital Signs Assessment: post-procedure vital signs reviewed and stable Respiratory status: respiratory function stable and spontaneous breathing Cardiovascular status: blood pressure returned to baseline Postop Assessment: no headache, no backache, spinal receding, patient able to bend at knees, adequate PO intake and no signs of nausea or vomiting Anesthetic complications: no    Last Vitals:  Vitals:   02/05/17 1650 02/05/17 1810  BP: 116/65 121/71  Pulse: 64 76  Resp: 16 16  Temp: 36.7 C 36.8 C    Last Pain:  Vitals:   02/05/17 1810  TempSrc: Oral  PainSc: 6    Pain Goal: Patients Stated Pain Goal: 4 (02/05/17 1810)               Katherina Mires

## 2017-02-06 LAB — CBC
HCT: 30.2 % — ABNORMAL LOW (ref 36.0–46.0)
HEMOGLOBIN: 10.4 g/dL — AB (ref 12.0–15.0)
MCH: 26.8 pg (ref 26.0–34.0)
MCHC: 34.4 g/dL (ref 30.0–36.0)
MCV: 77.8 fL — ABNORMAL LOW (ref 78.0–100.0)
PLATELETS: 317 10*3/uL (ref 150–400)
RBC: 3.88 MIL/uL (ref 3.87–5.11)
RDW: 14.7 % (ref 11.5–15.5)
WBC: 11.4 10*3/uL — ABNORMAL HIGH (ref 4.0–10.5)

## 2017-02-06 LAB — RPR: RPR Ser Ql: NONREACTIVE

## 2017-02-06 MED ORDER — OXYCODONE-ACETAMINOPHEN 5-325 MG PO TABS
1.0000 | ORAL_TABLET | ORAL | Status: DC | PRN
  Administered 2017-02-06 – 2017-02-08 (×7): 1 via ORAL
  Filled 2017-02-06 (×7): qty 1

## 2017-02-06 MED ORDER — OXYCODONE-ACETAMINOPHEN 5-325 MG PO TABS: 2.0000 | ORAL_TABLET | ORAL | Status: DC | PRN

## 2017-02-06 NOTE — Progress Notes (Addendum)
Amy Giles [id number]  Subjective: Postpartum Day 1: Primary LTC/S due to PROM, breech, removal of left ovarian dermoid cyst. Patient up ad lib, reports no syncope or dizziness.  Foley cath removed around 0615, no spontaneous void yet. Feeding:  Breast, but supplementing due to "no milk" Contraceptive plan:  Undecided--prior use of abstinence, didn't like OCPs in past, declines IUD, Depo, Nexplanon  Reviewed recent labs from office per patient request--TSH and vit D WNL, done due to c/o fatigue.  Objective: Temp:  [97.9 F (36.6 C)-98.6 F (37 C)] 98.4 F (36.9 C) (07/25 0630) Pulse Rate:  [60-90] 71 (07/25 0630) Resp:  [10-25] 16 (07/25 0630) BP: (96-128)/(56-77) 120/69 (07/25 0630) SpO2:  [95 %-100 %] 99 % (07/25 0624)   Vitals:   02/05/17 2210 02/06/17 0209 02/06/17 0624 02/06/17 0630  BP:  (!) 110/57  120/69  Pulse:  76  71  Resp: 18 19  16   Temp: 98.2 F (36.8 C) 98.4 F (36.9 C)  98.4 F (36.9 C)  TempSrc: Oral Oral  Oral  SpO2: 96%  99%     CBC Latest Ref Rng & Units 02/06/2017 02/05/2017  WBC 4.0 - 10.5 K/uL 11.4(H) 11.0(H)  Hemoglobin 12.0 - 15.0 g/dL 10.4(L) 11.3(L)  Hematocrit 36.0 - 46.0 % 30.2(L) 33.4(L)  Platelets 150 - 400 K/uL 317 323     Physical Exam:  General: alert Lochia: appropriate Uterine Fundus: firm Abdomen:  + bowel sounds Incision: Honeycomb dressing CDI--small amount old drainage noted DVT Evaluation: No evidence of DVT seen on physical exam. Negative Homan's sign.   Assessment/Plan: Status post cesarean delivery, day 1. Stable Continue current care. Reviewed contraceptive options, patient declines at present. Discussed normal breast milk production process, encouraged pumping if not feeding at breast.    Donnel Saxon MSN, CNM 02/06/2017, 8:11 AM

## 2017-02-07 MED ORDER — METOCLOPRAMIDE HCL 10 MG PO TABS
10.0000 mg | ORAL_TABLET | Freq: Four times a day (QID) | ORAL | Status: DC
  Filled 2017-02-07: qty 1

## 2017-02-07 NOTE — Progress Notes (Signed)
Subjective: Postpartum Day 2: Cesarean Delivery Patient reports no complaints.  Tolerating regular diet, + flatus and voiding without difficulty.  Pt does report no let down.    Objective: Vital signs in last 24 hours: Temp:  [98.4 F (36.9 C)-99 F (37.2 C)] 98.4 F (36.9 C) (07/26 0525) Pulse Rate:  [79-81] 81 (07/26 0525) Resp:  [18] 18 (07/26 0525) BP: (116-131)/(63-64) 116/64 (07/26 0525) Weight:  [103.4 kg (228 lb)] 103.4 kg (228 lb) (07/25 2100)  Physical Exam:  General: alert and no distress Lochia: appropriate Uterine Fundus: firm Incision: dressing with staining DVT Evaluation: No evidence of DVT seen on physical exam.   Recent Labs  02/05/17 0939 02/06/17 0650  HGB 11.3* 10.4*  HCT 33.4* 30.2*    Assessment/Plan: Status post Cesarean section. Doing well postoperatively.  Continue current care. Change Dressing Lactation Consult Trial of Reglan Anticipate discharge tomorrow  [name] 02/07/2017, 2:48 PM

## 2017-02-07 NOTE — Lactation Note (Signed)
This note was copied from a baby's chart. Lactation Consultation Note  Patient Name: Amy Giles [id number] Date: 02/07/2017  Mom states she choose to pump and bottle feed.  Baby is 51 hours old.  Mom concerned she is not obtaining milk with pumping/hand expression.  Reassured and discussed milk coming to volume 3-5 days after birth.  Observed mom pump and flange size looks good.  She has a pump at home but not sure what kind.  Recommended she has someone bring pump in for [place] to see.  Discussed a 2 week rental.  Offered assist with latch but mom declined and comfortable with pumping.   Maternal Data    Feeding Feeding Type: Bottle Fed - Formula  LATCH Score/Interventions                      Lactation Tools Discussed/Used     Consult Status      Ave Filter 02/07/2017, 11:21 AM

## 2017-02-08 ENCOUNTER — Encounter (HOSPITAL_COMMUNITY)
Admission: RE | Admit: 2017-02-08 | Discharge: 2017-02-08 | Disposition: A | Discharge status: Home / Self Care | Payer: Commercial Managed Care - PPO | Source: Ambulatory Visit

## 2017-02-08 ENCOUNTER — Encounter (HOSPITAL_COMMUNITY): Payer: Commercial Managed Care - PPO

## 2017-02-08 HISTORY — DX: Benign neoplasm of unspecified ovary: D27.9

## 2017-02-08 HISTORY — DX: Anogenital herpesviral infection, unspecified: A60.9

## 2017-02-08 HISTORY — DX: Sickle-cell trait: D57.3

## 2017-02-08 MED ORDER — OXYCODONE-ACETAMINOPHEN 5-325 MG PO TABS: 1.0000 | ORAL_TABLET | Freq: Four times a day (QID) | ORAL | 0 refills | Status: AC | PRN

## 2017-02-08 MED ORDER — IBUPROFEN 600 MG PO TABS: 600.0000 mg | ORAL_TABLET | Freq: Four times a day (QID) | ORAL | 2 refills | Status: AC | PRN

## 2017-02-08 NOTE — Discharge Summary (Signed)
OB Discharge Summary     Patient Name: Amy Giles DOB: 08/18/1980 MRN: [id number]  Date of admission: 02/05/2017 Delivering MD: Everett Graff   Delivery date:  02/05/17  Date of discharge: 02/08/2017  Admitting diagnosis: WATER BROKE Intrauterine pregnancy: [redacted]w[redacted]d     Secondary diagnosis:  Active Problems:   S/P primary low transverse C-section  Additional problems: NA     Discharge diagnosis: Term Pregnancy Delivered                                                                                                Post partum procedures:NA  Augmentation: NA  Complications: None  Hospital course:  Sceduled C/S   36 y.o. yo G2P1011 at [redacted]w[redacted]d was admitted to the hospital 02/05/2017 with SROM, known breech presentation, and was delivered by cesarean section due to the following indication:Malpresentation.  Membrane Rupture Time/Date: 8:00 AM ,02/05/2017   Patient delivered a Viable infant.02/05/2017  Details of operation can be found in separate operative note.  Pateint had an uncomplicated postpartum course.  She is ambulating, tolerating a regular diet, passing flatus, and urinating well. Patient is discharged home in stable condition on  02/08/17         Physical exam  Vitals:   02/06/17 2100 02/07/17 0525 02/07/17 1823 02/08/17 0620  BP:  116/64 132/68 115/68  Pulse:  81 76 72  Resp:  18 18   Temp:  98.4 F (36.9 C) 98.7 F (37.1 C) 98.3 F (36.8 C)  TempSrc:  Oral Oral Oral  SpO2:      Weight: 103.4 kg (228 lb)     Height: 5\' 5"  (1.651 m)      General: alert Lochia: appropriate Uterine Fundus: firm Incision: Healing well with no significant drainage DVT Evaluation: No evidence of DVT seen on physical exam. Labs: Lab Results  Component Value Date   WBC 11.4 (H) 02/06/2017   HGB 10.4 (L) 02/06/2017   HCT 30.2 (L) 02/06/2017   MCV 77.8 (L) 02/06/2017   PLT 317 02/06/2017   No flowsheet data found.  Discharge instruction: per After Visit Summary and "Baby  and Me Booklet".  After visit meds:  Allergies as of 02/08/2017      Reactions   Penicillins Rash   Has patient had a PCN reaction causing immediate rash, facial/tongue/throat swelling, SOB or lightheadedness with hypotension: Yes Has patient had a PCN reaction causing severe rash involving mucus membranes or skin necrosis: Yes Has patient had a PCN reaction that required hospitalization: No Has patient had a PCN reaction occurring within the last 10 years: No If all of the above answers are "NO", then may proceed with Cephalosporin use.      Medication List    STOP taking these medications   valACYclovir 1000 MG tablet Commonly known as:  VALTREX     TAKE these medications   calcium carbonate 500 MG chewable tablet Commonly known as:  TUMS - dosed in mg elemental calcium Chew 1-2 tablets by mouth daily.   ibuprofen 600 MG tablet Commonly known as:  ADVIL,MOTRIN Take 1 tablet (600 mg total)  by mouth every 6 (six) hours as needed.   oxyCODONE-acetaminophen 5-325 MG tablet Commonly known as:  PERCOCET/ROXICET Take 1 tablet by mouth every 6 (six) hours as needed for moderate pain.   prenatal multivitamin Tabs tablet Take 1 tablet by mouth daily at 12 noon.       Diet: routine diet  Activity: Advance as tolerated. Pelvic rest for 6 weeks.   Outpatient follow up:6 weeks Follow up Appt:No future appointments. Follow up Visit:No Follow-up on file.  Postpartum contraception: Undecided  Newborn Data: Live born female  Birth Weight: 8 lb 0.6 oz (3646 g) APGAR: 7,   Baby Feeding: Breast Disposition:home with mother   02/08/2017 Donnel Saxon, CNM

## 2017-02-08 NOTE — Lactation Note (Signed)
This note was copied from a baby's chart. Lactation Consultation Note  Patient Name: Amy Giles [id number] Date: 02/08/2017  Mom continues to pump every 3 hours.  She is not obtaining milk but breasts are feeling full.  She has an [employer]meda pump for discharge.  Plan is to pump and bottle feed.  Lactation outpatient services and support information reviewed and encouraged prn.   Maternal Data    Feeding    LATCH Score/Interventions                      Lactation Tools Discussed/Used     Consult Status      Ave Filter 02/08/2017, 9:50 AM

## 2017-02-08 NOTE — Discharge Instructions (Signed)

## 2017-02-11 ENCOUNTER — Inpatient Hospital Stay (HOSPITAL_COMMUNITY)
Admission: AD | Admit: 2017-02-11 | Payer: Commercial Managed Care - PPO | Source: Ambulatory Visit | Admitting: Obstetrics and Gynecology

## 2017-02-11 SURGERY — Surgical Case
Anesthesia: Regional

## 2017-02-13 ENCOUNTER — Telehealth (HOSPITAL_COMMUNITY): Payer: Self-pay | Admitting: Lactation Services

## 2017-02-13 NOTE — Telephone Encounter (Signed)
Lactation Telephone note:  Mom called with concerns about how much milk to pump off and what is considered  adequate.  Per mom have Ameda DEBP, pumping with #30 flange since Monday, wanted to try something different.and was pumping both sides in the hospital and until Monday. Have been getting 2 oz a day . Milk volume didn't start increasing until Saturday. Mom denies over fullness or engorgement issues. Mom denies soreness.  LC reviewed Supply and demand, importance of pumping at least 8 times a day , and add power pumping at least once a day.  10 mins on and off over 60 mins , or 20 mins on 10 mins off over 60 mins., sore nipple and engorgement prevention and tx reviewed, to keep feeding diary and pumping and to note the volume increasing every day.  LC discussed moms volume being delayed by a few days, so in the next few days the volume should increase to at a minimum of 500 ml or greater per day to know she is establishing her milk supply well.  LC reassured mom she is still is still in the window of time of the 1st 2 weeks.  LC also encouraged mom to call back with questions.

## 2018-01-24 IMAGING — US US MFM OB LIMITED
1 series · 14 of 14 positions shown · non-contrast
Comparison: none

[Series 1: us mfm ob limited · 14 of 14 slices shown]
[im 1/14]
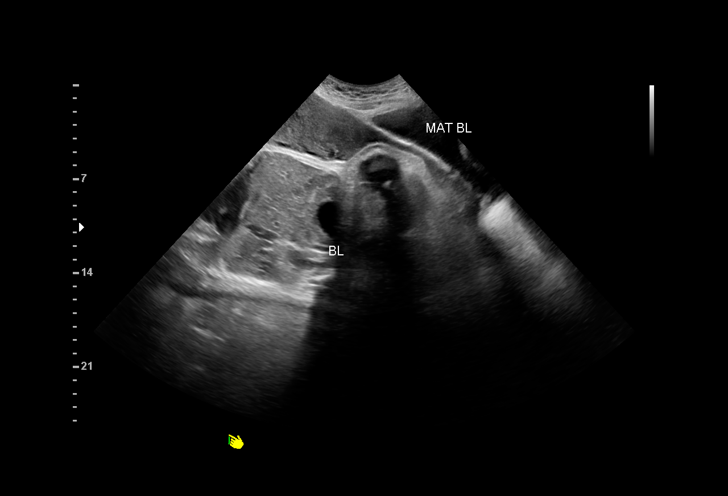
[im 2/14]
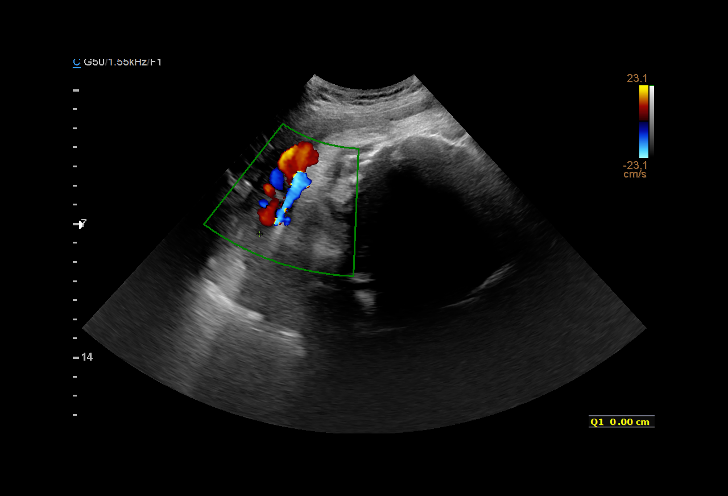
[im 3/14]
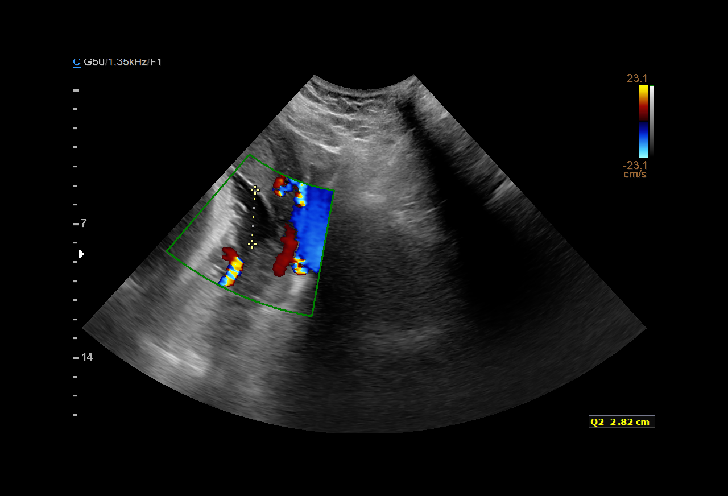
[im 4/14]
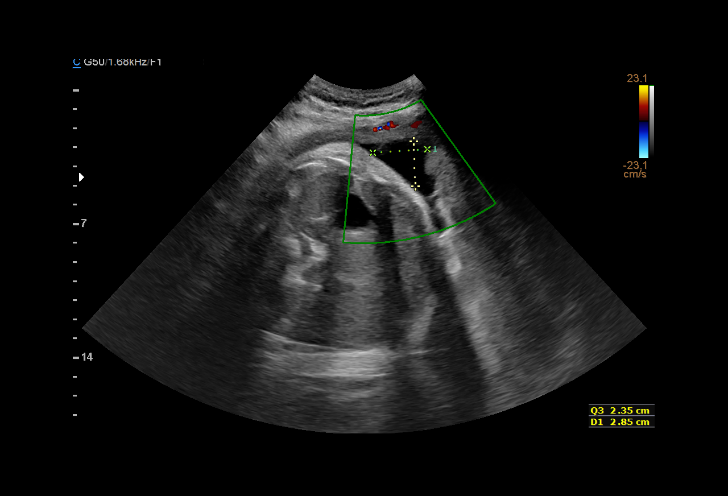
[im 5/14]
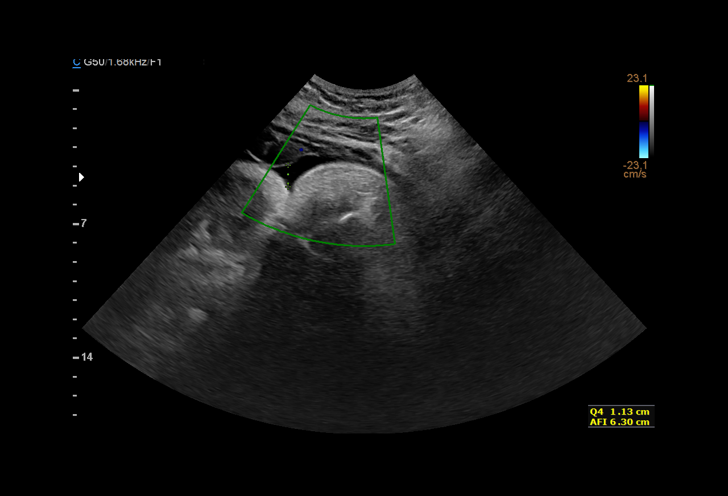
[im 6/14]
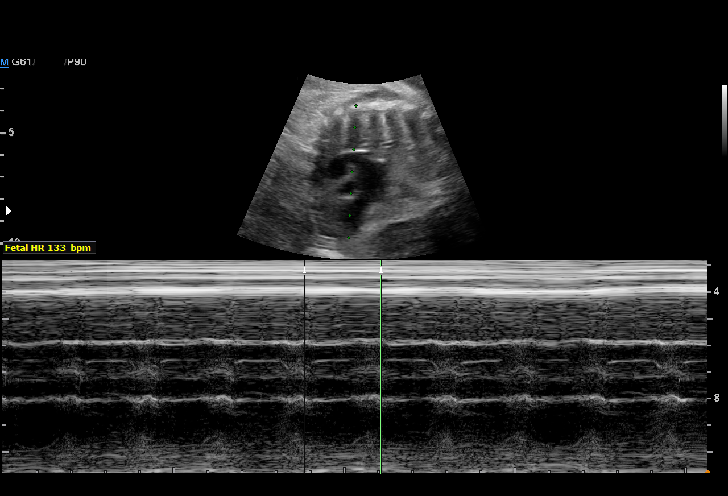
[im 7/14]
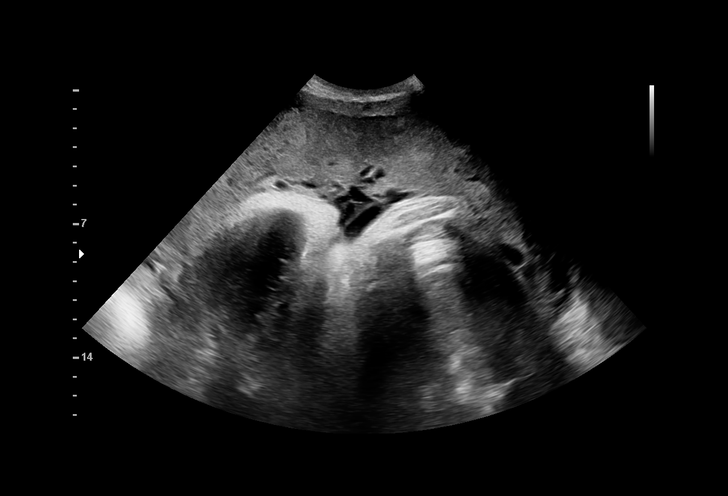
[im 8/14]
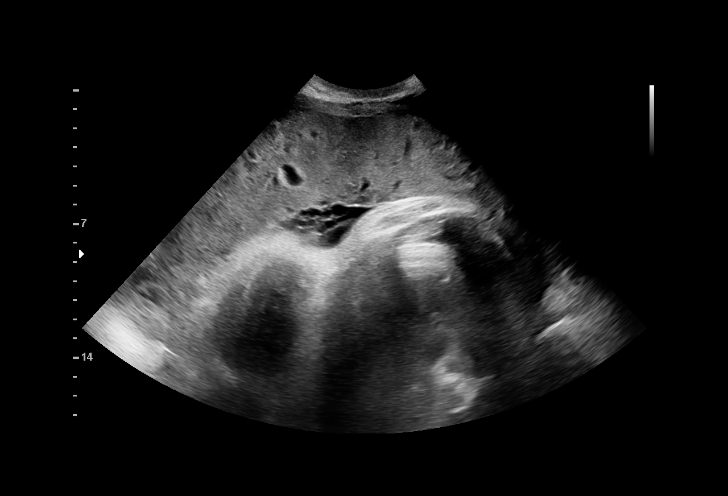
[im 9/14]
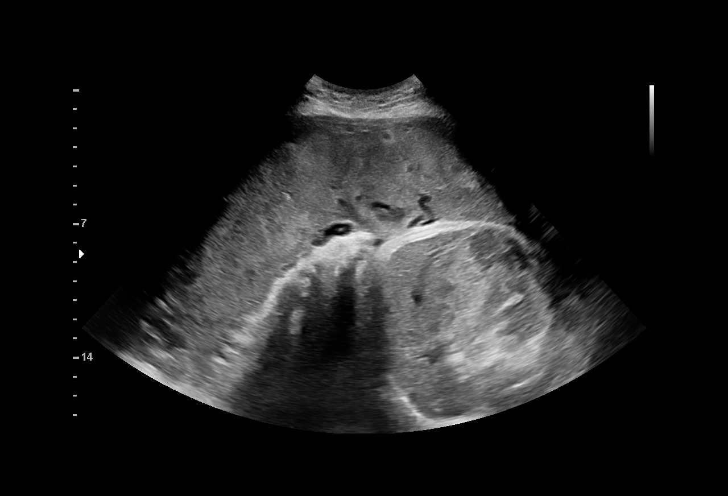
[im 10/14]
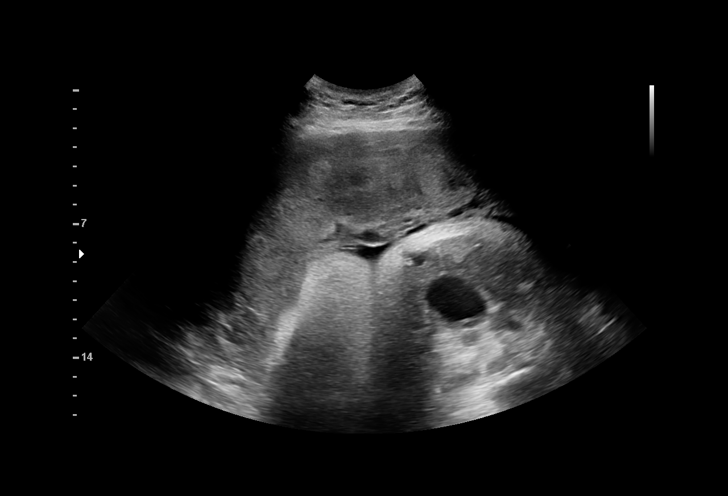
[im 11/14]
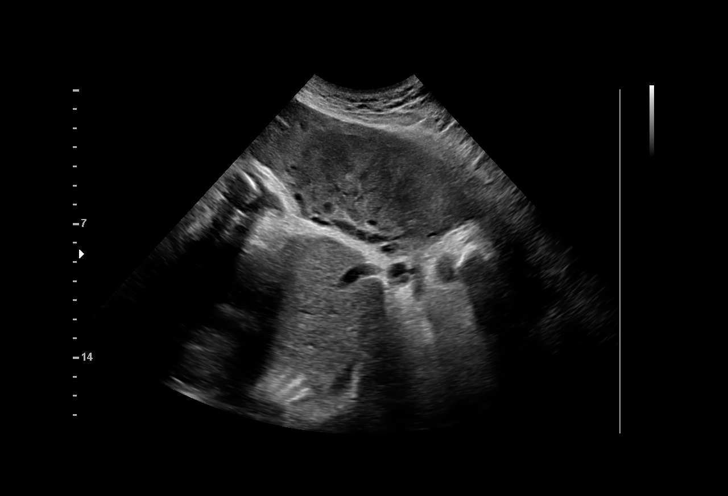
[im 12/14]
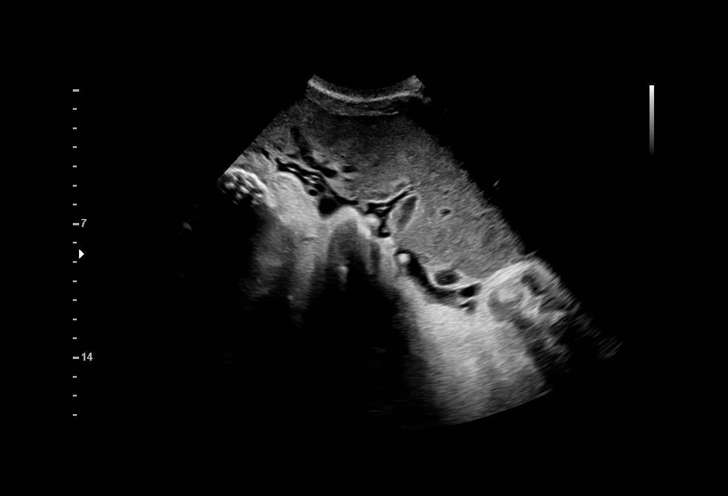
[im 13/14]
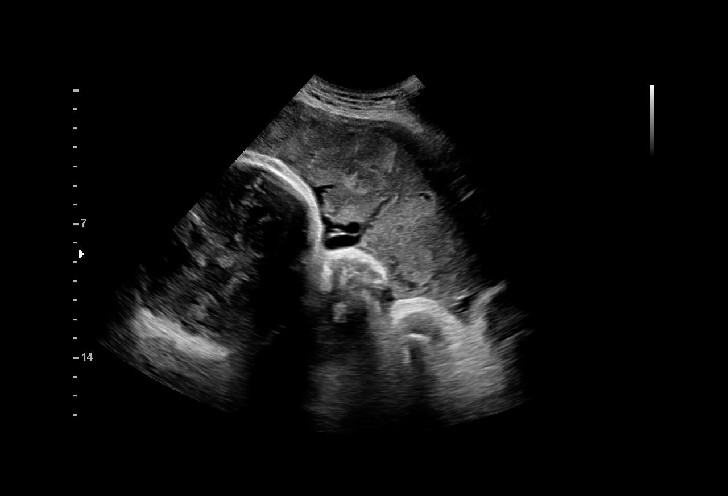
[im 14/14]
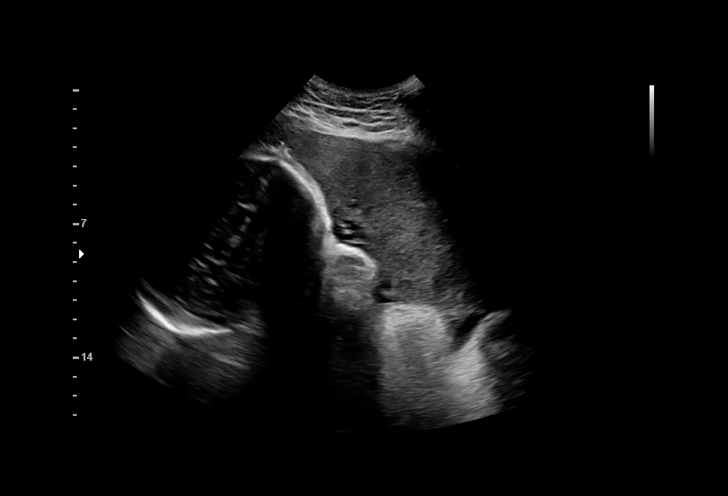

[14 of 14 positions shown; findings below may reference images not displayed]

OB/GYN
BRAMBILA CNM

1  OLGA GHIMIRE            [id number]       [id number]     [id number]
Indications

38 weeks gestation of pregnancy
Determine Fetal presentation by ultrasound
Leakage of amniotic fluid (rupture of
membranes)
OB History

Blood Type:            Height:  5'5"   Weight (lb):  228      BMI:
Gravidity:    2          SAB:   1
Fetal Evaluation

Num Of Fetuses:     1
Fetal Heart         133
Rate(bpm):
Cardiac Activity:   Observed
Presentation:       Breech
Placenta:           Anterior, above cervical os

Amniotic Fluid
AFI FV:      Subjectively low-normal

AFI Sum(cm)     %Tile       Largest Pocket(cm)
6.3             < 3

RUQ(cm)       RLQ(cm)       LUQ(cm)        LLQ(cm)
0
Gestational Age

LMP:           38w 2d       Date:   05/13/16                 EDD:   02/17/17
Best:          38w 2d    Det. By:   LMP  (05/13/16)          EDD:   02/17/17
Cervix Uterus Adnexa

Cervix
Not visualized (advanced GA >16wks)
Impression

Single IUP at 38w 2d
Limited ultrasound performed to determine fetal presentation
Breech presentation
Anterior placenta without previa
Low-normal amniotic fluid volume (AFI 6.3 cm)
Recommendations

Follow-up ultrasounds as clinically indicated.

## 2019-02-09 ENCOUNTER — Other Ambulatory Visit: Payer: Self-pay

## 2019-02-09 DIAGNOSIS — Z20822 Contact with and (suspected) exposure to covid-19: Secondary | ICD-10-CM

## 2019-02-11 LAB — NOVEL CORONAVIRUS, NAA: SARS-CoV-2, NAA: NOT DETECTED
